# Patient Record
Sex: Female | Born: 2000 | Race: White | Hispanic: No | Marital: Single | Smoking: Never smoker
Health system: Southern US, Community
[De-identification: ages and names within clinical notes are randomized; demographics above are authoritative.]

## PROBLEM LIST (undated history)

## (undated) DIAGNOSIS — F419 Anxiety disorder, unspecified: Secondary | ICD-10-CM

## (undated) DIAGNOSIS — F32A Depression, unspecified: Secondary | ICD-10-CM

## (undated) DIAGNOSIS — F329 Major depressive disorder, single episode, unspecified: Secondary | ICD-10-CM

## (undated) DIAGNOSIS — F909 Attention-deficit hyperactivity disorder, unspecified type: Secondary | ICD-10-CM

## (undated) HISTORY — PX: NO PAST SURGERIES: SHX2092

---
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
## 2019-06-26 ENCOUNTER — Encounter (HOSPITAL_COMMUNITY): Payer: Self-pay | Admitting: Emergency Medicine

## 2019-06-26 ENCOUNTER — Emergency Department (HOSPITAL_COMMUNITY): Payer: BC Managed Care – PPO

## 2019-06-26 ENCOUNTER — Other Ambulatory Visit: Payer: Self-pay

## 2019-06-26 ENCOUNTER — Emergency Department (HOSPITAL_COMMUNITY)
Admission: EM | Admit: 2019-06-26 | Discharge: 2019-06-27 | Disposition: A | Discharge status: Home / Self Care | Payer: BC Managed Care – PPO | Attending: Emergency Medicine | Admitting: Emergency Medicine

## 2019-06-26 DIAGNOSIS — S0081XA Abrasion of other part of head, initial encounter: Secondary | ICD-10-CM | POA: Diagnosis not present

## 2019-06-26 DIAGNOSIS — R1011 Right upper quadrant pain: Secondary | ICD-10-CM | POA: Diagnosis not present

## 2019-06-26 DIAGNOSIS — Z23 Encounter for immunization: Secondary | ICD-10-CM | POA: Diagnosis not present

## 2019-06-26 DIAGNOSIS — S22089A Unspecified fracture of T11-T12 vertebra, initial encounter for closed fracture: Secondary | ICD-10-CM | POA: Diagnosis not present

## 2019-06-26 DIAGNOSIS — R1013 Epigastric pain: Secondary | ICD-10-CM | POA: Diagnosis not present

## 2019-06-26 DIAGNOSIS — Y998 Other external cause status: Secondary | ICD-10-CM | POA: Diagnosis not present

## 2019-06-26 DIAGNOSIS — T1490XA Injury, unspecified, initial encounter: Secondary | ICD-10-CM

## 2019-06-26 DIAGNOSIS — Y9389 Activity, other specified: Secondary | ICD-10-CM | POA: Diagnosis not present

## 2019-06-26 DIAGNOSIS — S7012XA Contusion of left thigh, initial encounter: Secondary | ICD-10-CM | POA: Diagnosis not present

## 2019-06-26 DIAGNOSIS — S71112A Laceration without foreign body, left thigh, initial encounter: Secondary | ICD-10-CM | POA: Diagnosis not present

## 2019-06-26 DIAGNOSIS — S0990XA Unspecified injury of head, initial encounter: Secondary | ICD-10-CM | POA: Diagnosis present

## 2019-06-26 DIAGNOSIS — Y92410 Unspecified street and highway as the place of occurrence of the external cause: Secondary | ICD-10-CM | POA: Insufficient documentation

## 2019-06-26 LAB — I-STAT CHEM 8, ED
BUN: 7 mg/dL (ref 6–20)
Calcium, Ion: 1.06 mmol/L — ABNORMAL LOW (ref 1.15–1.40)
Chloride: 108 mmol/L (ref 98–111)
Creatinine, Ser: 0.6 mg/dL (ref 0.44–1.00)
Glucose, Bld: 177 mg/dL — ABNORMAL HIGH (ref 70–99)
HCT: 44 % (ref 36.0–46.0)
Hemoglobin: 15 g/dL (ref 12.0–15.0)
Potassium: 3.5 mmol/L (ref 3.5–5.1)
Sodium: 139 mmol/L (ref 135–145)
TCO2: 22 mmol/L (ref 22–32)

## 2019-06-26 LAB — CBC
HCT: 43.7 % (ref 36.0–46.0)
Hemoglobin: 14.3 g/dL (ref 12.0–15.0)
MCH: 28.8 pg (ref 26.0–34.0)
MCHC: 32.7 g/dL (ref 30.0–36.0)
MCV: 87.9 fL (ref 80.0–100.0)
Platelets: 259 10*3/uL (ref 150–400)
RBC: 4.97 MIL/uL (ref 3.87–5.11)
RDW: 13.1 % (ref 11.5–15.5)
WBC: 15.6 10*3/uL — ABNORMAL HIGH (ref 4.0–10.5)
nRBC: 0 % (ref 0.0–0.2)

## 2019-06-26 LAB — COMPREHENSIVE METABOLIC PANEL
ALT: 28 U/L (ref 0–44)
AST: 33 U/L (ref 15–41)
Albumin: 3.8 g/dL (ref 3.5–5.0)
Alkaline Phosphatase: 77 U/L (ref 38–126)
Anion gap: 11 (ref 5–15)
BUN: 6 mg/dL (ref 6–20)
CO2: 20 mmol/L — ABNORMAL LOW (ref 22–32)
Calcium: 9.2 mg/dL (ref 8.9–10.3)
Chloride: 107 mmol/L (ref 98–111)
Creatinine, Ser: 0.82 mg/dL (ref 0.44–1.00)
GFR calc Af Amer: >60 mL/min (ref >60)
GFR calc non Af Amer: >60 mL/min (ref >60)
Glucose, Bld: 180 mg/dL — ABNORMAL HIGH (ref 70–99)
Potassium: 3.5 mmol/L (ref 3.5–5.1)
Sodium: 138 mmol/L (ref 135–145)
Total Bilirubin: 0.8 mg/dL (ref 0.3–1.2)
Total Protein: 7.6 g/dL (ref 6.5–8.1)

## 2019-06-26 LAB — ETHANOL: Alcohol, Ethyl (B): <10 mg/dL (ref <10)

## 2019-06-26 LAB — PROTIME-INR
INR: 1.2 (ref 0.8–1.2)
Prothrombin Time: 15.3 seconds — ABNORMAL HIGH (ref 11.4–15.2)

## 2019-06-26 LAB — SAMPLE TO BLOOD BANK

## 2019-06-26 LAB — I-STAT BETA HCG BLOOD, ED (MC, WL, AP ONLY): I-stat hCG, quantitative: <5 m[IU]/mL (ref <5)

## 2019-06-26 LAB — LACTIC ACID, PLASMA: Lactic Acid, Venous: 1.6 mmol/L (ref 0.5–1.9)

## 2019-06-26 LAB — CDS SEROLOGY

## 2019-06-26 MED ORDER — FENTANYL CITRATE (PF) 100 MCG/2ML IJ SOLN
50.0000 ug | Freq: Once | INTRAMUSCULAR | Status: AC
  Administered 2019-06-26: 50 ug via INTRAVENOUS
  Filled 2019-06-26: qty 2

## 2019-06-26 MED ORDER — IOHEXOL 300 MG/ML  SOLN
100.0000 mL | Freq: Once | INTRAMUSCULAR | Status: AC | PRN
  Administered 2019-06-26: 100 mL via INTRAVENOUS

## 2019-06-26 MED ORDER — TETANUS-DIPHTH-ACELL PERTUSSIS 5-2.5-18.5 LF-MCG/0.5 IM SUSP
0.5000 mL | Freq: Once | INTRAMUSCULAR | Status: AC
  Administered 2019-06-26: 0.5 mL via INTRAMUSCULAR
  Filled 2019-06-26: qty 0.5

## 2019-06-26 NOTE — ED Notes (Signed)
Patient spoke with her father over the phone .

## 2019-06-26 NOTE — ED Notes (Signed)
Patient transported to CT scan . 
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
## 2019-06-27 MED ORDER — CYCLOBENZAPRINE HCL 10 MG PO TABS: 10.0000 mg | ORAL_TABLET | Freq: Two times a day (BID) | ORAL | 0 refills | Status: AC | PRN

## 2019-06-27 MED ORDER — IBUPROFEN 800 MG PO TABS: 800.0000 mg | ORAL_TABLET | Freq: Three times a day (TID) | ORAL | 0 refills | Status: AC | PRN

## 2019-06-27 NOTE — Discharge Instructions (Addendum)
IMPRESSION:  CT THORACIC SPINE IMPRESSION   Mild compression fractures through the superior endplate of J28 and  A06.   CT LUMBAR SPINE IMPRESSION   Mild disc protrusion centrally at L5-S1.
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
## 2020-10-04 ENCOUNTER — Encounter (HOSPITAL_COMMUNITY): Payer: Self-pay | Admitting: Behavioral Health

## 2022-10-07 ENCOUNTER — Ambulatory Visit: Payer: BC Managed Care – PPO | Admitting: Internal Medicine
